# Patient Record
Sex: Male | Born: 1980 | Hispanic: Yes | Marital: Married | Smoking: Current every day smoker
Health system: Southern US, Community
[De-identification: ages and names within clinical notes are randomized; demographics above are authoritative.]

---

## 2015-04-11 ENCOUNTER — Emergency Department (HOSPITAL_COMMUNITY): Payer: Self-pay

## 2015-04-11 ENCOUNTER — Emergency Department (HOSPITAL_COMMUNITY)
Admission: EM | Admit: 2015-04-11 | Discharge: 2015-04-11 | Disposition: A | Discharge status: Home / Self Care | Payer: Self-pay | Attending: Emergency Medicine | Admitting: Emergency Medicine

## 2015-04-11 ENCOUNTER — Encounter (HOSPITAL_COMMUNITY): Payer: Self-pay | Admitting: *Deleted

## 2015-04-11 DIAGNOSIS — S299XXA Unspecified injury of thorax, initial encounter: Secondary | ICD-10-CM | POA: Insufficient documentation

## 2015-04-11 DIAGNOSIS — Y998 Other external cause status: Secondary | ICD-10-CM | POA: Insufficient documentation

## 2015-04-11 DIAGNOSIS — S3992XA Unspecified injury of lower back, initial encounter: Secondary | ICD-10-CM | POA: Insufficient documentation

## 2015-04-11 DIAGNOSIS — Y9389 Activity, other specified: Secondary | ICD-10-CM | POA: Insufficient documentation

## 2015-04-11 DIAGNOSIS — Y92410 Unspecified street and highway as the place of occurrence of the external cause: Secondary | ICD-10-CM | POA: Insufficient documentation

## 2015-04-11 DIAGNOSIS — S199XXA Unspecified injury of neck, initial encounter: Secondary | ICD-10-CM | POA: Insufficient documentation

## 2015-04-11 DIAGNOSIS — S8991XA Unspecified injury of right lower leg, initial encounter: Secondary | ICD-10-CM | POA: Insufficient documentation

## 2015-04-11 DIAGNOSIS — S0990XA Unspecified injury of head, initial encounter: Secondary | ICD-10-CM | POA: Insufficient documentation

## 2015-04-11 DIAGNOSIS — Z720 Tobacco use: Secondary | ICD-10-CM | POA: Insufficient documentation

## 2015-04-11 DIAGNOSIS — Z880 Allergy status to penicillin: Secondary | ICD-10-CM | POA: Insufficient documentation

## 2015-04-11 DIAGNOSIS — S79921A Unspecified injury of right thigh, initial encounter: Secondary | ICD-10-CM | POA: Insufficient documentation

## 2015-04-11 MED ORDER — CYCLOBENZAPRINE HCL 10 MG PO TABS: 10.0000 mg | ORAL_TABLET | Freq: Two times a day (BID) | ORAL | Status: AC | PRN

## 2015-04-11 MED ORDER — IBUPROFEN 800 MG PO TABS: 800.0000 mg | ORAL_TABLET | Freq: Three times a day (TID) | ORAL | Status: AC

## 2015-04-11 NOTE — ED Notes (Signed)
Pt monitored by pulse ox and bp cuff. 

## 2015-04-11 NOTE — ED Notes (Signed)
PT removed from LSB by Gwendolyn Grant, MD & [place], [place], while c spine was maintained
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
